# Patient Record
Sex: Female | Born: 1965 | Race: Black or African American | Hispanic: No
Health system: Southern US, Community
[De-identification: ages and names within clinical notes are randomized; demographics above are authoritative.]

## PROBLEM LIST (undated history)

## (undated) ENCOUNTER — Ambulatory Visit (HOSPITAL_COMMUNITY): Payer: Federal, State, Local not specified - PPO

## (undated) DIAGNOSIS — G4733 Obstructive sleep apnea (adult) (pediatric): Secondary | ICD-10-CM

## (undated) DIAGNOSIS — K5909 Other constipation: Secondary | ICD-10-CM

## (undated) DIAGNOSIS — I10 Essential (primary) hypertension: Secondary | ICD-10-CM

## (undated) DIAGNOSIS — K219 Gastro-esophageal reflux disease without esophagitis: Secondary | ICD-10-CM

## (undated) DIAGNOSIS — I471 Supraventricular tachycardia, unspecified: Secondary | ICD-10-CM

## (undated) DIAGNOSIS — E785 Hyperlipidemia, unspecified: Secondary | ICD-10-CM

## (undated) DIAGNOSIS — M5430 Sciatica, unspecified side: Secondary | ICD-10-CM

## (undated) HISTORY — DX: Other constipation: K59.09

## (undated) HISTORY — PX: BREAST BIOPSY: SHX20

## (undated) HISTORY — DX: Hyperlipidemia, unspecified: E78.5

## (undated) HISTORY — PX: KNEE SURGERY: SHX244

## (undated) HISTORY — DX: Obstructive sleep apnea (adult) (pediatric): G47.33

---

## 2008-02-11 HISTORY — PX: LAPAROSCOPIC VAGINAL HYSTERECTOMY: SUR798

## 2020-02-11 HISTORY — PX: COLONOSCOPY: SHX174

## 2020-02-23 LAB — HM COLONOSCOPY

## 2020-06-04 ENCOUNTER — Encounter (HOSPITAL_COMMUNITY): Payer: Self-pay | Admitting: *Deleted

## 2020-06-04 ENCOUNTER — Emergency Department (HOSPITAL_COMMUNITY): Payer: 59

## 2020-06-04 ENCOUNTER — Emergency Department (HOSPITAL_COMMUNITY)
Admission: EM | Admit: 2020-06-04 | Discharge: 2020-06-05 | Disposition: A | Discharge status: Home / Self Care | Payer: 59 | Attending: Emergency Medicine | Admitting: Emergency Medicine

## 2020-06-04 DIAGNOSIS — R079 Chest pain, unspecified: Secondary | ICD-10-CM | POA: Diagnosis not present

## 2020-06-04 DIAGNOSIS — I10 Essential (primary) hypertension: Secondary | ICD-10-CM | POA: Insufficient documentation

## 2020-06-04 DIAGNOSIS — M25531 Pain in right wrist: Secondary | ICD-10-CM | POA: Insufficient documentation

## 2020-06-04 DIAGNOSIS — M542 Cervicalgia: Secondary | ICD-10-CM | POA: Insufficient documentation

## 2020-06-04 DIAGNOSIS — M25511 Pain in right shoulder: Secondary | ICD-10-CM | POA: Insufficient documentation

## 2020-06-04 DIAGNOSIS — M79642 Pain in left hand: Secondary | ICD-10-CM | POA: Insufficient documentation

## 2020-06-04 DIAGNOSIS — M25562 Pain in left knee: Secondary | ICD-10-CM | POA: Diagnosis not present

## 2020-06-04 DIAGNOSIS — Y92410 Unspecified street and highway as the place of occurrence of the external cause: Secondary | ICD-10-CM | POA: Diagnosis not present

## 2020-06-04 HISTORY — DX: Sciatica, unspecified side: M54.30

## 2020-06-04 HISTORY — DX: Supraventricular tachycardia, unspecified: I47.10

## 2020-06-04 HISTORY — DX: Supraventricular tachycardia: I47.1

## 2020-06-04 HISTORY — DX: Essential (primary) hypertension: I10

## 2020-06-04 MED ORDER — METHOCARBAMOL 500 MG PO TABS: 500.0000 mg | ORAL_TABLET | Freq: Two times a day (BID) | ORAL | 0 refills | Status: AC

## 2020-06-04 MED ORDER — KETOROLAC TROMETHAMINE 15 MG/ML IJ SOLN
15.0000 mg | Freq: Once | INTRAMUSCULAR | Status: AC
  Administered 2020-06-04: 15 mg via INTRAMUSCULAR
  Filled 2020-06-04: qty 1

## 2020-06-04 MED ORDER — NAPROXEN 375 MG PO TABS: 375.0000 mg | ORAL_TABLET | Freq: Two times a day (BID) | ORAL | 0 refills | Status: AC
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
## 2020-06-04 NOTE — Discharge Instructions (Signed)
I have prescribed muscle relaxers for your pain, please do not drink or drive while taking this medications as they can make you drowsy.    Please follow-up with PCP in 1 week for reevaluation of your symptoms.    If you experience any headache, shortness of breath or worsening symptoms please return to the ED.
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
## 2020-06-04 NOTE — ED Triage Notes (Signed)
Neck and back pain and soreness in bilateral forearms.

## 2022-06-20 ENCOUNTER — Encounter (HOSPITAL_COMMUNITY): Payer: Self-pay | Admitting: Emergency Medicine

## 2022-06-20 ENCOUNTER — Ambulatory Visit (HOSPITAL_COMMUNITY)
Admission: EM | Admit: 2022-06-20 | Discharge: 2022-06-20 | Disposition: A | Discharge status: Home / Self Care | Payer: Federal, State, Local not specified - PPO | Attending: Internal Medicine | Admitting: Internal Medicine

## 2022-06-20 ENCOUNTER — Other Ambulatory Visit: Payer: Self-pay

## 2022-06-20 DIAGNOSIS — N3000 Acute cystitis without hematuria: Secondary | ICD-10-CM

## 2022-06-20 HISTORY — DX: Gastro-esophageal reflux disease without esophagitis: K21.9

## 2022-06-20 LAB — POCT URINALYSIS DIP (MANUAL ENTRY)
Bilirubin, UA: NEGATIVE
Blood, UA: NEGATIVE
Glucose, UA: NEGATIVE mg/dL
Ketones, POC UA: NEGATIVE mg/dL
Nitrite, UA: POSITIVE — AB
Protein Ur, POC: NEGATIVE mg/dL
Spec Grav, UA: 1.02
Urobilinogen, UA: 0.2 U/dL
pH, UA: 7

## 2022-06-20 MED ORDER — MICONAZOLE 3 200 MG VA SUPP: 200.0000 mg | Freq: Every day | VAGINAL | 0 refills | Status: DC

## 2022-06-20 MED ORDER — AMOXICILLIN-POT CLAVULANATE 875-125 MG PO TABS: 1.0000 | ORAL_TABLET | Freq: Two times a day (BID) | ORAL | 0 refills | Status: AC

## 2022-06-20 NOTE — Discharge Instructions (Addendum)
Your urine color is pending if any changes in treatment is warranted based on culture results we will call you directly the number provided on your chart.

## 2022-06-20 NOTE — ED Triage Notes (Signed)
Pt here for urinary urgency/frequency and burning with urination.  Sx since Tuesday.  No fevers.  Hx of UTI and feels same.

## 2022-06-20 NOTE — ED Provider Notes (Signed)
[facility]    CSN: [id number] Arrival date & time: 06/20/22  1731      History   Chief Complaint Chief Complaint  Patient presents with   Urinary Frequency    HPI Martha Salazar is a 57 y.o. female.   HPI Martha Salazar is a 57 y.o. female presents for evaluation of urinary frequency, urgency and dysuria x 4 days, without flank pain, fever, chills, or abnormal vaginal discharge or bleeding. Previous history of recurrent  UTI, last UTI 08/08/2021 was treated with Augmentin and cleared without complications.  Patient uncertain of urine pathology.  No LMP recorded. Patient is postmenopausal.   Past Medical History:  Diagnosis Date   Acid reflux    Hypertension    Sciatica    SVT (supraventricular tachycardia)     There are no problems to display for this patient.   History reviewed. No pertinent surgical history.  OB History   No obstetric history on file.      Home Medications    Prior to Admission medications   Medication Sig Start Date End Date Taking? Authorizing Provider  amoxicillin-clavulanate (AUGMENTIN) 875-125 MG tablet Take 1 tablet by mouth every 12 (twelve) hours for 10 days. 06/20/22 06/30/22 Yes Bing Neighbors, NP  carvedilol (COREG) 25 MG tablet Take 25 mg by mouth 2 (two) times daily with a meal.   Yes [provider]  diltiazem (CARDIZEM) 30 MG tablet Take 30 mg by mouth daily as needed (tachycardia).   Yes [provider]  DULoxetine (CYMBALTA) 30 MG capsule Take 30 mg by mouth daily.   Yes [provider]  flecainide (TAMBOCOR) 50 MG tablet Take 50 mg by mouth 2 (two) times daily.   Yes [provider]  linaclotide (LINZESS) 72 MCG capsule Take 72 mcg by mouth daily before breakfast.   Yes [provider]  losartan (COZAAR) 100 MG tablet Take 100 mg by mouth daily.   Yes [provider]  miconazole (MICONAZOLE 3) 200 MG vaginal suppository Place 1 suppository (200 mg total)  vaginally at bedtime. 06/20/22  Yes Bing Neighbors, NP  omeprazole (PRILOSEC) 20 MG capsule Take 20 mg by mouth daily.   Yes [provider]  spironolactone (ALDACTONE) 25 MG tablet Take 25 mg by mouth daily.   Yes [provider]  VITAMIN D, CHOLECALCIFEROL, PO Take by mouth.   Yes [provider]    Family History History reviewed. No pertinent family history.  Social History Social History   Substance Use Topics   Alcohol use: Never   Drug use: Never     Allergies   Hctz [hydrochlorothiazide], Sulfa antibiotics, and Wound dressing adhesive   Review of Systems Review of Systems Pertinent negatives listed in HPI   Physical Exam Triage Vital Signs ED Triage Vitals [06/20/22 1739]  Enc Vitals Group     BP (!) 158/89     Pulse Rate 73     Resp 16     Temp 98.4 F (36.9 C)     Temp Source Oral     SpO2 98 %     Weight 167 lb (75.8 kg)     Height 5\' 3"  (1.6 m)     Head Circumference      Peak Flow      Pain Score 0     Pain Loc      Pain Edu?      Excl. in GC?    No data found.  Updated Vital  Signs BP (!) 158/89   Pulse 73   Temp 98.4 F (36.9 C) (Oral)   Resp 16   Ht 5\' 3"  (1.6 m)   Wt 167 lb (75.8 kg)   SpO2 98%   BMI 29.58 kg/m   Visual Acuity Right Eye Distance:   Left Eye Distance:   Bilateral Distance:    Right Eye Near:   Left Eye Near:    Bilateral Near:     Physical Exam General appearance: alert, well developed, well nourished, cooperative  Head: Normocephalic, without obvious abnormality, atraumatic Heart: Rate and Rhythm normal.   Respiratory: Respirations even and unlabored, normal respiratory rate CVA:  No flank pain Skin: Skin color, texture, turgor normal. No rashes seen  Psych: Appropriate mood and affect.   UC Treatments / Results  Labs (all labs ordered are listed, but only abnormal results are displayed) Labs Reviewed  POCT URINALYSIS DIP (MANUAL ENTRY) - Abnormal; Notable for the following  components:      Result Value   Nitrite, UA Positive (*)    Leukocytes, UA Moderate (2+) (*)    All other components within normal limits    EKG   Radiology No results found.  Procedures Procedures (including critical care time)  Medications Ordered in UC Medications - No data to display  Initial Impression / Assessment and Plan / UC Course  I have reviewed the triage vital signs and the nursing notes.  Pertinent labs & imaging results that were available during my care of the patient were reviewed by me and considered in my medical decision making (see chart for details).     UA abnormal and findings consistent with UTI. Empiric antibiotic treatment initiated.  Encouraged increase intake of water. Recommended cranberry tablets, wiping from front to back, and urinating prior to and after cortis to reduce risk for reinfection. Urine culture pending.  ER if symptoms become severe. Follow-up with PCP if symptoms do not completely resolve.   Final Clinical Impressions(s) / UC Diagnoses   Final diagnoses:  Acute cystitis without hematuria     Discharge Instructions      Your urine color is pending if any changes in treatment is warranted based on culture results we will call you directly the number provided on your chart.     ED Prescriptions     Medication Sig Dispense Auth. Provider   amoxicillin-clavulanate (AUGMENTIN) 875-125 MG tablet Take 1 tablet by mouth every 12 (twelve) hours for 10 days. 20 tablet Bing Neighbors, NP   miconazole (MICONAZOLE 3) 200 MG vaginal suppository Place 1 suppository (200 mg total) vaginally at bedtime. 3 suppository Bing Neighbors, NP      PDMP not reviewed this encounter.   Bing Neighbors, NP 06/20/22 1939

## 2022-08-20 ENCOUNTER — Ambulatory Visit: Payer: Federal, State, Local not specified - PPO | Admitting: Medical

## 2022-08-20 ENCOUNTER — Ambulatory Visit
Admission: RE | Admit: 2022-08-20 | Discharge: 2022-08-20 | Disposition: A | Discharge status: Home / Self Care | Payer: Federal, State, Local not specified - PPO | Source: Ambulatory Visit | Attending: Medical | Admitting: Medical

## 2022-08-20 VITALS — BP 120/70 | HR 72 | Temp 97.4°F | Ht 64.0 in | Wt 173.8 lb

## 2022-08-20 DIAGNOSIS — M542 Cervicalgia: Secondary | ICD-10-CM | POA: Diagnosis not present

## 2022-08-20 DIAGNOSIS — M549 Dorsalgia, unspecified: Secondary | ICD-10-CM

## 2022-08-20 DIAGNOSIS — G8929 Other chronic pain: Secondary | ICD-10-CM
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
## 2022-08-20 NOTE — Patient Instructions (Signed)
Please go to [facility] for your neck xray.   Their hours are 8am - 4:30 pm Monday - Friday.  Take your insurance card with you.  [facility] [phone]   [address] [place], [place] [address]

## 2022-08-26 NOTE — Progress Notes (Signed)
C-spine x-ray shows degenerative changes in the neck but also some disc space narrowing similar to prior x-ray.  X-ray does not show nerves or disc so we are limited in what we can see.  Recommendation at this point would be referral to physical therapy or chiropractor or if significant pain down on arms such as pain numbness or tingling we could pursue MRI of the C-spine.  Generally I would want someone to do a trial of physical therapy or chiropractic therapy first  See how she wants to move forward

## 2022-10-27 ENCOUNTER — Other Ambulatory Visit: Payer: Self-pay

## 2022-10-27 ENCOUNTER — Other Ambulatory Visit: Payer: Self-pay | Admitting: Medical

## 2022-10-27 ENCOUNTER — Encounter: Payer: Self-pay | Admitting: Pharmacist

## 2022-10-27 MED ORDER — FLUOCINONIDE 0.05 % EX SOLN
1.0000 | Freq: Two times a day (BID) | CUTANEOUS | 1 refills | Status: DC
  Filled 2022-10-27: qty 60, 1d supply, fill #0

## 2022-10-27 MED ORDER — DULOXETINE HCL 30 MG PO CPEP
30.0000 mg | ORAL_CAPSULE | Freq: Every day | ORAL | 0 refills | Status: DC
  Filled 2022-10-27: qty 90, 90d supply, fill #0

## 2022-10-27 MED ORDER — TIZANIDINE HCL 2 MG PO TABS
2.0000 mg | ORAL_TABLET | Freq: Every evening | ORAL | 1 refills | Status: AC | PRN
  Filled 2022-10-27: qty 30, 30d supply, fill #0

## 2022-10-27 MED ORDER — LINACLOTIDE 72 MCG PO CAPS
72.0000 ug | ORAL_CAPSULE | Freq: Every day | ORAL | 0 refills | Status: DC
  Filled 2022-10-27: qty 90, 90d supply, fill #0

## 2022-10-29 ENCOUNTER — Other Ambulatory Visit: Payer: Self-pay | Admitting: Medical

## 2022-11-10 ENCOUNTER — Other Ambulatory Visit: Payer: Self-pay

## 2022-11-12 MED ORDER — DULOXETINE HCL 30 MG PO CPEP: 30.0000 mg | ORAL_CAPSULE | Freq: Every day | ORAL | 0 refills | Status: DC

## 2022-11-12 MED ORDER — LINACLOTIDE 72 MCG PO CAPS: 72.0000 ug | ORAL_CAPSULE | Freq: Every day | ORAL | 0 refills | Status: DC

## 2022-11-12 MED ORDER — FLUOCINONIDE 0.05 % EX SOLN: 1.0000 | Freq: Two times a day (BID) | CUTANEOUS | 1 refills | Status: AC

## 2022-12-31 ENCOUNTER — Telehealth: Payer: Self-pay | Admitting: Medical

## 2022-12-31 NOTE — Telephone Encounter (Signed)
P.A. Rolan Lipa

## 2023-02-15 NOTE — Telephone Encounter (Signed)
 P.A. approved til 11/25
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
## 2023-03-30 ENCOUNTER — Other Ambulatory Visit: Payer: Self-pay | Admitting: Medical

## 2023-03-30 DIAGNOSIS — Z0000 Encounter for general adult medical examination without abnormal findings: Secondary | ICD-10-CM

## 2023-04-24 ENCOUNTER — Ambulatory Visit
Admission: RE | Admit: 2023-04-24 | Discharge: 2023-04-24 | Disposition: A | Discharge status: Home / Self Care | Payer: Federal, State, Local not specified - PPO | Source: Ambulatory Visit | Attending: Medical | Admitting: Medical

## 2023-04-24 DIAGNOSIS — Z0000 Encounter for general adult medical examination without abnormal findings: Secondary | ICD-10-CM

## 2023-05-21 ENCOUNTER — Ambulatory Visit: Admitting: Medical

## 2023-05-21 VITALS — BP 120/70 | HR 64 | Wt 162.2 lb

## 2023-05-21 DIAGNOSIS — K5909 Other constipation: Secondary | ICD-10-CM

## 2023-05-21 DIAGNOSIS — K644 Residual hemorrhoidal skin tags: Secondary | ICD-10-CM

## 2023-05-21 DIAGNOSIS — K602 Anal fissure, unspecified: Secondary | ICD-10-CM

## 2023-05-21 MED ORDER — DILTIAZEM GEL 2 %
1.0000 | Freq: Four times a day (QID) | CUTANEOUS | 1 refills | Status: AC
Start: 2023-05-21 — End: ?

## 2023-05-21 MED ORDER — HYDROCORTISONE 2.5 % EX CREA: TOPICAL_CREAM | Freq: Two times a day (BID) | CUTANEOUS | 1 refills | Status: AC

## 2023-05-21 NOTE — Progress Notes (Signed)
 Called med into [facility].

## 2023-05-21 NOTE — Progress Notes (Signed)
 Subjective:  Martha Salazar is a 58 y.o. female who presents for Chief Complaint  Patient presents with   Hemorrhoids    Hemorrhoids and fissure for 3 weeks. It has finally gone down to a pea size. Stopped bleeding this past monday     Here for concerns.  She has a history of anal fissure and external hemorrhoids.  For the past 3 weeks she has been having flareup of both.  A few weeks ago she had a quarter size bright red hemorrhoid that was bleeding and tender but it is calm down to the size of a pea now.  She has been using witch hazel topically for this.  She also has a history of fissure on and off.  She is seen different providers for this in the past.  At 1 point she even got Botox but this left her with problems with incontinence of feces for about 2 years on and off.  She will never do that again.  She is not as tender today as she has been last 3 weeks.  She uses tissue and wet wipes daily.  She has a history of constipation.  Uses Linzess often but not daily.  She does get a lot of fiber and water in the diet.  No other aggravating or relieving factors.    No other c/o.  Past Medical History:  Diagnosis Date   Acid reflux    Hypertension    Sciatica    SVT (supraventricular tachycardia) (HCC)    Current Outpatient Medications on File Prior to Visit  Medication Sig Dispense Refill   carvedilol (COREG) 25 MG tablet Take 25 mg by mouth 2 (two) times daily with a meal.     DULoxetine (CYMBALTA) 30 MG capsule Take 1 capsule (30 mg total) by mouth daily. 90 capsule 0   flecainide (TAMBOCOR) 50 MG tablet Take 50 mg by mouth 2 (two) times daily.     fluocinonide (LIDEX) 0.05 % external solution Apply 1 Application topically 2 (two) times daily. 60 mL 1   linaclotide (LINZESS) 72 MCG capsule Take 1 capsule (72 mcg total) by mouth daily before breakfast. 90 capsule 0   losartan (COZAAR) 100 MG tablet Take 100 mg by mouth daily.     omeprazole (PRILOSEC) 20 MG capsule Take 20 mg by  mouth daily.     spironolactone (ALDACTONE) 25 MG tablet Take 25 mg by mouth daily.     tiZANidine (ZANAFLEX) 2 MG tablet Take 1 tablet (2 mg total) by mouth at bedtime as needed for muscle spasms. 30 tablet 1   VITAMIN D, CHOLECALCIFEROL, PO Take by mouth.     No current facility-administered medications on file prior to visit.     The following portions of the patient's history were reviewed and updated as appropriate: allergies, current medications, past family history, past medical history, past social history, past surgical history and problem list.  ROS Otherwise as in subjective above    Objective: BP 120/70   Pulse 64   Wt 162 lb 3.2 oz (73.6 kg)   BMI 27.84 kg/m   General appearance: alert, no distress, well developed, well nourished DRE: Left lateral of anus with a 8 mm diameter nontender hemorrhoid is resolving.  No obvious fissure today.  Nontender to palpation.  No other abnormality.  Exam chaperoned by nurse.    Assessment: Encounter Diagnoses  Name Primary?   External hemorrhoid Yes   Rectal fissure    Chronic constipation  Plan: We discussed symptoms, prior history, recommendations  Advised when she has a hemorrhoid flareup to use hot water soaks for 20 minutes at a time.  She can use hydrocortisone cream periodically as needed for flareup of hemorrhoid for 3 to 5 days at a time.  We will call out diltiazem gel if she develops another fissure.  She does not have an obvious fissure today.  Referral for a local GI doctor.  Her prior gastroenterologist was in the  [facility] area.  Counseled on chronic constipation, prevention, water and fiber intake.  Martha Salazar was seen today for hemorrhoids.  Diagnoses and all orders for this visit:  External hemorrhoid -     Ambulatory referral to Gastroenterology  Rectal fissure -     Ambulatory referral to Gastroenterology  Chronic constipation -     Ambulatory referral to Gastroenterology  Other  orders -     hydrocortisone 2.5 % cream; Apply topically 2 (two) times daily. -     diltiazem 2 % GEL; Apply 1 Application topically 4 (four) times daily.    Follow up: for physical soon

## 2023-07-21 ENCOUNTER — Other Ambulatory Visit: Payer: Self-pay | Admitting: Medical

## 2023-07-24 ENCOUNTER — Telehealth: Payer: Self-pay

## 2023-07-24 NOTE — Telephone Encounter (Signed)
   Copied from CRM [id number]. Topic: General - Other >> Jul 24, 2023  8:44 AM Marissa P wrote: Reason for CRM: Patient says doctor did a referral to gi, they called but told her to submit a request to be transferred to them. The patient did, they called saying need to make an appt but haven't gotten a call back since. Please follow up with patient because its been awhile she hasn't heard nothing and needs the information again.

## 2023-07-28 NOTE — Telephone Encounter (Signed)
 Appointment scheduled.

## 2023-07-30 ENCOUNTER — Other Ambulatory Visit: Payer: Self-pay | Admitting: Medical

## 2023-07-30 ENCOUNTER — Ambulatory Visit: Admitting: Family Medicine

## 2023-07-30 VITALS — BP 128/90 | HR 88 | Wt 167.8 lb

## 2023-07-30 DIAGNOSIS — R198 Other specified symptoms and signs involving the digestive system and abdomen: Secondary | ICD-10-CM

## 2023-07-30 DIAGNOSIS — E739 Lactose intolerance, unspecified: Secondary | ICD-10-CM

## 2023-07-30 DIAGNOSIS — I4891 Unspecified atrial fibrillation: Secondary | ICD-10-CM

## 2023-07-30 DIAGNOSIS — Z8719 Personal history of other diseases of the digestive system: Secondary | ICD-10-CM

## 2023-07-30 DIAGNOSIS — R110 Nausea: Secondary | ICD-10-CM | POA: Diagnosis not present

## 2023-07-30 DIAGNOSIS — G8929 Other chronic pain: Secondary | ICD-10-CM

## 2023-07-30 MED ORDER — DULOXETINE HCL 30 MG PO CPEP: 30.0000 mg | ORAL_CAPSULE | Freq: Every day | ORAL | 0 refills | Status: AC

## 2023-07-30 NOTE — Progress Notes (Signed)
   Subjective:    Patient ID: Martha Salazar, female    DOB: [birth year], 58 y.o.   MRN: [id number]  HPI She is here for evaluation of a 2-week history of difficulty with nausea and malaise as well as increased flatus as well as eructation.  She also notes that her previous history of constipation and use of Linzess  is not needed as she is actually having increased bowel movements.  She states that they are foul-smelling but did not have a greasy or rancid component to it.  Food can cause an increase in gas in both directions.  She also describes 1 episode of some mid chest pain but the symptoms went away relatively quickly.  She also has a history of lactose intolerance and uses Lactaid on an as-needed basis.  She also has a history of atrial fibrillation and is on flecainide.  She is scheduled for complete exam in July.   Review of Systems     Objective:    Physical Exam Alert and in no distress.  Cardiac exam shows regular rhythm without murmurs or gallops.  Lungs are clear to auscultation.  Abdominal exam shows no masses or tenderness but very active bowel sounds.       Assessment & Plan:  Lactose intolerance  History of constipation  Nausea  Atrial fibrillation, unspecified type (HCC)  Borborygmi Use the Lactaid regularly.  Double your Prilosec dosing to 2 pills/day.  Take a probiotic She does have an appointment set up on July 8.  Her symptoms are suggestive of a malabsorption type issue so I want to see if doubling the Prilosec as well as continue with the Lactaid and probiotic has any effect on this.  If not she might need to be seen by gastroenterology.

## 2023-07-30 NOTE — Telephone Encounter (Signed)
 Copied from CRM [id number]. Topic: Clinical - Medication Refill >> Jul 30, 2023  1:17 PM Carlatta H wrote: Medication: DULoxetine  (CYMBALTA ) 30 MG capsule [[id number]]  Has the patient contacted their pharmacy? Yes (Agent: If no, request that the patient contact the pharmacy for the refill. If patient does not wish to contact the pharmacy document the reason why and proceed with request.) (Agent: If yes, when and what did the pharmacy advise?)No longer has prescription  This is the patient's preferred pharmacy:  [id number] - [place], [place] - [address] AT [facility] [address] [place] [address] Phone: [phone] Fax: [phone]    Is this the correct pharmacy for this prescription? Yes If no, delete pharmacy and type the correct one.   Has the prescription been filled recently? No  Is the patient out of the medication? Yes  Has the patient been seen for an appointment in the last year OR does the patient have an upcoming appointment? Yes  Can we respond through MyChart? No  Agent: Please be advised that Rx refills may take up to 3 business days. We ask that you follow-up with your pharmacy.

## 2023-07-30 NOTE — Telephone Encounter (Signed)
 Last apt 05/21/23

## 2023-07-30 NOTE — Addendum Note (Signed)
 Addended by: Alycea Segoviano, Abraham Hoffmann E on: 07/30/2023 05:42 PM   Modules accepted: Orders

## 2023-07-30 NOTE — Patient Instructions (Signed)
 Use the Lactaid regularly.  Double your Prilosec dosing to 2 pills/day.  Take a probiotic

## 2023-08-18 ENCOUNTER — Ambulatory Visit: Admitting: Medical

## 2023-08-18 ENCOUNTER — Encounter: Payer: Self-pay | Admitting: Medical

## 2023-08-18 VITALS — BP 130/90 | HR 85 | Ht 64.0 in | Wt 169.2 lb

## 2023-08-18 DIAGNOSIS — N951 Menopausal and female climacteric states: Secondary | ICD-10-CM

## 2023-08-18 DIAGNOSIS — Z23 Encounter for immunization: Secondary | ICD-10-CM

## 2023-08-18 DIAGNOSIS — Z0000 Encounter for general adult medical examination without abnormal findings: Secondary | ICD-10-CM | POA: Diagnosis not present

## 2023-08-18 DIAGNOSIS — Z90710 Acquired absence of both cervix and uterus: Secondary | ICD-10-CM | POA: Diagnosis not present

## 2023-08-18 DIAGNOSIS — Z780 Asymptomatic menopausal state: Secondary | ICD-10-CM | POA: Insufficient documentation

## 2023-08-18 DIAGNOSIS — R002 Palpitations: Secondary | ICD-10-CM

## 2023-08-18 DIAGNOSIS — I4891 Unspecified atrial fibrillation: Secondary | ICD-10-CM

## 2023-08-18 DIAGNOSIS — Z13220 Encounter for screening for lipoid disorders: Secondary | ICD-10-CM

## 2023-08-18 DIAGNOSIS — I471 Supraventricular tachycardia, unspecified: Secondary | ICD-10-CM

## 2023-08-18 DIAGNOSIS — Z1389 Encounter for screening for other disorder: Secondary | ICD-10-CM

## 2023-08-18 DIAGNOSIS — M79604 Pain in right leg: Secondary | ICD-10-CM

## 2023-08-18 LAB — LIPID PANEL

## 2023-08-18 NOTE — Progress Notes (Signed)
abstracted 

## 2023-08-18 NOTE — Progress Notes (Signed)
 Subjective:   HPI  Martha Salazar is a 58 y.o. female who presents for Chief Complaint  Patient presents with   Annual Exam    Fasting cpe, hot flashes a lot, would like referral to GYN for her breast and pelvic.     Patient Care Team: Jaslin Novitski, Alm GORMAN RIGGERS as PCP - General (Family Medicine) Dr. Marko Go and Jenkins Farr, PA, cardiology at [facility] Dr. Delon Her, bariatrics Dr. Johnston Isaac, neurology Dr. Jenel Lash, ophthalmology Dr. Urban July, pain medicine/sleep medicine Dentist   Concerns: Here for well visit.  Fasting today.  She gets right leg pain and sciatica flareup periodically.  She notes that she had an injury to her muscle and nerve with tourniquet being too tight from prior knee surgery.  Has had problems with the right leg ever since  Lately she has been having problems with orthostatic hypotension.  She is seeing cardiology.  She just saw them a week ago.  He was on Ozempic prior to help control weight and did well with this including less swelling in her legs.  She uses spironolactone for blood pressure and swelling.  She is having trouble getting Ozempic.   seeing bariatric clinic    Reviewed their medical, surgical, family, social, medication, and allergy history and updated chart as appropriate.  Allergies  Allergen Reactions   Amlodipine Swelling    Lower ext.   Diltiazem  Other (See Comments)    Pill only- causes Hot flashes   Hctz [Hydrochlorothiazide]    Sulfa Antibiotics Hives and Swelling   Wound Dressing Adhesive     Anything Adhesive will burn her    Past Medical History:  Diagnosis Date   Acid reflux    Chronic constipation    Hypertension    OSA (obstructive sleep apnea)    oral airway device   Sciatica    SVT (supraventricular tachycardia) (HCC)     Current Outpatient Medications on File Prior to Visit  Medication Sig Dispense Refill   carvedilol (COREG) 25 MG tablet Take 25 mg by mouth 2 (two) times daily with a meal.      diltiazem  2 % GEL Apply 1 Application topically 4 (four) times daily. 30 g 1   DULoxetine  (CYMBALTA ) 30 MG capsule Take 1 capsule (30 mg total) by mouth daily. 90 capsule 0   flecainide (TAMBOCOR) 50 MG tablet Take 50 mg by mouth 2 (two) times daily.     fluocinonide  (LIDEX ) 0.05 % external solution Apply 1 Application topically 2 (two) times daily. 60 mL 1   hydrocortisone  2.5 % cream Apply topically 2 (two) times daily. 30 g 1   LINZESS  72 MCG capsule TAKE 1 CAPSULE BY MOUTH DAILY BEFORE BREAKFAST. 90 capsule 0   losartan (COZAAR) 100 MG tablet Take 100 mg by mouth daily.     omeprazole (PRILOSEC) 20 MG capsule Take 20 mg by mouth daily.     spironolactone (ALDACTONE) 25 MG tablet Take 25 mg by mouth daily.     tiZANidine  (ZANAFLEX ) 2 MG tablet Take 1 tablet (2 mg total) by mouth at bedtime as needed for muscle spasms. 30 tablet 1   VITAMIN D , CHOLECALCIFEROL, PO Take by mouth. (Patient not taking: Reported on 07/30/2023)     No current facility-administered medications on file prior to visit.      Current Outpatient Medications:    carvedilol (COREG) 25 MG tablet, Take 25 mg by mouth 2 (two) times daily with a meal., Disp: , Rfl:  diltiazem  2 % GEL, Apply 1 Application topically 4 (four) times daily., Disp: 30 g, Rfl: 1   DULoxetine  (CYMBALTA ) 30 MG capsule, Take 1 capsule (30 mg total) by mouth daily., Disp: 90 capsule, Rfl: 0   flecainide (TAMBOCOR) 50 MG tablet, Take 50 mg by mouth 2 (two) times daily., Disp: , Rfl:    fluocinonide  (LIDEX ) 0.05 % external solution, Apply 1 Application topically 2 (two) times daily., Disp: 60 mL, Rfl: 1   hydrocortisone  2.5 % cream, Apply topically 2 (two) times daily., Disp: 30 g, Rfl: 1   LINZESS  72 MCG capsule, TAKE 1 CAPSULE BY MOUTH DAILY BEFORE BREAKFAST., Disp: 90 capsule, Rfl: 0   losartan (COZAAR) 100 MG tablet, Take 100 mg by mouth daily., Disp: , Rfl:    omeprazole (PRILOSEC) 20 MG capsule, Take 20 mg by mouth daily., Disp: , Rfl:     spironolactone (ALDACTONE) 25 MG tablet, Take 25 mg by mouth daily., Disp: , Rfl:    tiZANidine  (ZANAFLEX ) 2 MG tablet, Take 1 tablet (2 mg total) by mouth at bedtime as needed for muscle spasms., Disp: 30 tablet, Rfl: 1   VITAMIN D , CHOLECALCIFEROL, PO, Take by mouth. (Patient not taking: Reported on 07/30/2023), Disp: , Rfl:   Family History  Problem Relation Age of Onset   Cancer Mother        lung   Cancer Sister        lung   Heart disease Paternal Grandmother    Breast cancer Paternal Grandmother 29 - 61   Heart disease Paternal Grandfather     Past Surgical History:  Procedure Laterality Date   BREAST BIOPSY Left    CESAREAN SECTION     COLONOSCOPY  02/2020   repeat 2027   KNEE SURGERY Right    meniscal repair   LAPAROSCOPIC VAGINAL HYSTERECTOMY  2010   still has ovaries      Review of Systems  Constitutional:  Negative for chills, fever, malaise/fatigue and weight loss.  HENT:  Negative for congestion, ear pain, hearing loss, sore throat and tinnitus.   Eyes:  Negative for blurred vision, pain and redness.  Respiratory:  Negative for cough, hemoptysis and shortness of breath.   Cardiovascular:  Positive for palpitations. Negative for chest pain, orthopnea, claudication and leg swelling.  Gastrointestinal:  Negative for abdominal pain, blood in stool, constipation, diarrhea, nausea and vomiting.  Genitourinary:  Negative for dysuria, flank pain, frequency, hematuria and urgency.  Musculoskeletal:  Negative for falls, joint pain and myalgias.  Skin:  Negative for itching and rash.  Neurological:  Positive for sensory change and weakness. Negative for dizziness, tingling, speech change and headaches.  Endo/Heme/Allergies:  Negative for polydipsia. Does not bruise/bleed easily.  Psychiatric/Behavioral:  Negative for depression and memory loss. The patient is not nervous/anxious and does not have insomnia.         Objective:  BP (!) 130/90   Pulse 85   Ht 5' 4  (1.626 m)   Wt 169 lb 3.2 oz (76.7 kg)   SpO2 99%   BMI 29.04 kg/m   BP Readings from Last 3 Encounters:  08/18/23 (!) 130/90  07/30/23 (!) 128/90  05/21/23 120/70   Wt Readings from Last 3 Encounters:  08/18/23 169 lb 3.2 oz (76.7 kg)  07/30/23 167 lb 12.8 oz (76.1 kg)  05/21/23 162 lb 3.2 oz (73.6 kg)    General appearance: alert, no distress, WD/WN, African American female Skin: no worrisome lesions HEENT: normocephalic, conjunctiva/corneas normal, sclerae anicteric,  PERRLA, EOMi, nares patent, no discharge or erythema, pharynx normal Oral cavity: MMM, tongue normal, teeth normal Neck: supple, no lymphadenopathy, no thyromegaly, no masses, normal ROM, no bruits Chest: non tender, normal shape and expansion Heart: RRR, normal S1, S2, no murmurs Lungs: CTA bilaterally, no wheezes, rhonchi, or rales Abdomen: +bs, soft, non tender, non distended, no masses, no hepatomegaly, no splenomegaly, no bruits Back: non tender, normal ROM, no scoliosis Musculoskeletal: upper extremities non tender, no obvious deformity, normal ROM throughout, lower extremities non tender, no obvious deformity, normal ROM throughout Extremities: no edema, no cyanosis, no clubbing Pulses: 2+ symmetric, upper and lower extremities, normal cap refill Neurological: alert, oriented x 3, CN2-12 intact, strength normal upper extremities and lower extremities, sensation normal throughout, DTRs 2+ throughout, no cerebellar signs, gait normal Psychiatric: normal affect, behavior normal, pleasant  Breast/gyn/rectal - deferred to gynecology     Assessment and Plan :   Encounter Diagnoses  Name Primary?   Encounter for health maintenance examination in adult Yes   H/O vaginal hysterectomy    Postmenopausal estrogen deficiency    Hot flash, menopausal    Palpitation    Atrial fibrillation, unspecified type (HCC)    SVT (supraventricular tachycardia) (HCC)    Right leg pain    Screening for lipid disorders     Screening for hematuria or proteinuria    Need for pneumococcal 20-valent conjugate vaccination      This visit was a preventative care visit, also known as wellness visit or routine physical.   Topics typically include healthy lifestyle, diet, exercise, preventative care, vaccinations, sick and well care, proper use of emergency dept and after hours care, as well as other concerns.    Separate significant issues discussed: Chronic constipation that she continues on Linzess .  Recent issues with fissure, constipation, hemorrhoid.  She is following up with GI soon  SVT history, history of A-fib as well and recently has had problems with orthostatic hypotension-follow-up with cardiology soon as planned.  I reviewed the visit she had from a week ago with cardiology as well as blood work including comprehensive metabolic panel and CBC.  Albumin slightly low otherwise labs were fine.  Hot flashes, menopausal symptoms-referral at her request to gynecology  Sciatica/leg pain - stretch daily.  Consider phyiscal therapy or ortho consult   General Recommendations: Continue to return yearly for your annual wellness and preventative care visits.  This gives us  a chance to discuss healthy lifestyle, exercise, vaccinations, review your chart record, and perform screenings where appropriate.  I recommend you see your eye doctor yearly for routine vision care.  I recommend you see your dentist yearly for routine dental care including hygiene visits twice yearly.   Vaccination recommendations were reviewed Immunization History  Administered Date(s) Administered   Influenza, Seasonal, Injecte, Preservative Fre 01/06/2023   Influenza-Unspecified 10/29/2011, 10/13/2012, 11/24/2012, 11/30/2013, 11/08/2014, 11/07/2015, 11/05/2016, 10/29/2017   PFIZER Comirnaty(Gray Top)Covid-19 Tri-Sucrose Vaccine 03/07/2019, 04/01/2019, 01/27/2020, 06/01/2020   PNEUMOCOCCAL CONJUGATE-20 08/18/2023   Tdap 07/12/2010,  11/07/2020   Zoster Recombinant(Shingrix) 11/26/2017, 01/29/2018    Vaccine recommendations: Yearly flu shot, prevnar 20  Vaccines administered today: Counseled on the pneumococcal vaccine.  Vaccine information sheet given.  Pneumococcal vaccine Prevnar 20 given after consent obtained.    Screening for cancer: Colon cancer screening: Prior or last colon cancer screen: 2022, due 2027, but given recent GI issues, may need other eval.  Breast cancer screening: You should perform a self breast exam monthly.   We reviewed recommendations for regular  mammograms and breast cancer screening. Last mammogram: normal 04/2023.  Cervical cancer screening: We reviewed recommendations for pap smear screening. Last pap - s/p hysterectomy   Skin cancer screening: Check your skin regularly for new changes, growing lesions, or other lesions of concern Come in for evaluation if you have skin lesions of concern.  Lung cancer screening: If you have a greater than 20 pack year history of tobacco use, then you may qualify for lung cancer screening with a chest CT scan.   Please call your insurance company to inquire about coverage for this test.  Pancreatic cancer: no current screening test is available routinely recommended.  (Risk factors: Smoking, overweight or obese, diabetes, chronic pancreatitis, work Nurse, mental health, Solicitor, 76 year old or greater, female greater than female, African-American, family history of pancreatic cancer, hereditary breast, ovarian, melanoma, Lynch, Peutz-jeghers).  We currently don't have screenings for other cancers besides breast, cervical, colon, and lung cancers.  If you have a strong family history of cancer or have other cancer screening concerns, please let me know.    Bone health: Get at least 150 minutes of aerobic exercise weekly Get weight bearing exercise at least once weekly Bone density test:  A bone density test is an imaging test that uses a  type of X-ray to measure the amount of calcium and other minerals in your bones. The test may be used to diagnose or screen you for a condition that causes weak or thin bones (osteoporosis), predict your risk for a broken bone (fracture), or determine how well your osteoporosis treatment is working. The bone density test is recommended for females 65 and older, or females or males <65 if certain risk factors such as thyroid disease, long term use of steroids such as for asthma or rheumatological issues, vitamin D  deficiency, estrogen deficiency, family history of osteoporosis, self or family history of fragility fracture in first degree relative.  No prior bone density screen.   Order placed.  Expect a phone call to schedule   Heart health: Get at least 150 minutes of aerobic exercise weekly Limit alcohol It is important to maintain a healthy blood pressure and healthy cholesterol numbers  Heart disease screening: Screening for heart disease includes screening for blood pressure, fasting lipids, glucose/diabetes screening, BMI height to weight ratio, reviewed of smoking status, physical activity, and diet.    Goals include blood pressure 120/80 or less, maintaining a healthy lipid/cholesterol profile, preventing diabetes or keeping diabetes numbers under good control, not smoking or using tobacco products, exercising most days per week or at least 150 minutes per week of exercise, and eating healthy variety of fruits and vegetables, healthy oils, and avoiding unhealthy food choices like fried food, fast food, high sugar and high cholesterol foods.     Vascular disease screening: For high risk individuals including smokers, diabetes, patients with known heart disease or high blood pressure, kidney disease, and others, screening for vascular disease or atherosclerosis of the arteries is available.  Examples may include carotid ultrasound, abdominal aortic ultrasound, ABI blood flow screening in  the legs, thoracic aorta screening.    Medical care options: I recommend you continue to seek care here first for routine care.  We try really hard to have available appointments Monday through Friday daytime hours for sick visits, acute visits, and physicals.  Urgent care should be used for after hours and weekends for significant issues that cannot wait till the next day.  The emergency department should be used for significant potentially life-threatening emergencies.  The emergency department is expensive, can often have long wait times for less significant concerns, so try to utilize primary care, urgent care, or telemedicine when possible to avoid unnecessary trips to the emergency department.  Virtual visits and telemedicine have been introduced since the pandemic started in 2020, and can be convenient ways to receive medical care.  We offer virtual appointments as well to assist you in a variety of options to seek medical care.   Legal Take the time to do a Last Will and Testament, advanced directives including Healthcare Power of Attorney and Living Will documents.  Do not leave your family with burdens that can be handled ahead of time.   Advanced Directives: I recommend you consider completing a Health Care Power of Attorney and Living Will.   These documents respect your wishes and help alleviate burdens on your loved ones if you were to become terminally ill or be in a position to need those documents enforced.    You can complete Advanced Directives yourself, have them notarized, then have copies made for our office, for you and for anybody you feel should have them in safe keeping.  Or, you can have an attorney prepare these documents.   If you haven't updated your Last Will and Testament in a while, it may be worthwhile having an attorney prepare these documents together and save on some costs.      Duane was seen today for annual exam.  Diagnoses and all orders for this  visit:  Encounter for health maintenance examination in adult -     Ambulatory referral to Gynecology -     DG Bone Density; Future -     TSH + free T4 -     Lipid panel -     VITAMIN D  25 Hydroxy (Vit-D Deficiency, Fractures) -     Urinalysis, Routine w reflex microscopic -     Magnesium  H/O vaginal hysterectomy  Postmenopausal estrogen deficiency -     DG Bone Density; Future  Hot flash, menopausal  Palpitation -     Magnesium  Atrial fibrillation, unspecified type (HCC)  SVT (supraventricular tachycardia) (HCC) -     Magnesium  Right leg pain  Screening for lipid disorders -     Lipid panel  Screening for hematuria or proteinuria -     Urinalysis, Routine w reflex microscopic  Need for pneumococcal 20-valent conjugate vaccination -     Pneumococcal conjugate vaccine 20-valent (Prevnar 20)    Follow-up pending labs, yearly for physical

## 2023-08-19 ENCOUNTER — Other Ambulatory Visit: Payer: Self-pay | Admitting: Medical

## 2023-08-19 ENCOUNTER — Ambulatory Visit: Payer: Self-pay | Admitting: Medical

## 2023-08-19 LAB — URINALYSIS, ROUTINE W REFLEX MICROSCOPIC
Bilirubin, UA: NEGATIVE
Glucose, UA: NEGATIVE
Ketones, UA: NEGATIVE
Leukocytes,UA: NEGATIVE
Nitrite, UA: NEGATIVE
RBC, UA: NEGATIVE
Specific Gravity, UA: 1.021 (ref 1.005–1.030)
Urobilinogen, Ur: 0.2 mg/dL (ref 0.2–1.0)
pH, UA: 7.5 (ref 5.0–7.5)

## 2023-08-19 LAB — TSH+FREE T4
Free T4: 1.09 ng/dL (ref 0.82–1.77)
TSH: 1.45 u[IU]/mL (ref 0.450–4.500)

## 2023-08-19 LAB — VITAMIN D 25 HYDROXY (VIT D DEFICIENCY, FRACTURES): Vit D, 25-Hydroxy: 25.2 ng/mL — AB (ref 30.0–100.0)

## 2023-08-19 LAB — LIPID PANEL
Cholesterol, Total: 261 mg/dL — AB (ref 100–199)
HDL: 79 mg/dL (ref >39)
LDL CALC COMMENT:: 3.3 ratio (ref 0.0–4.4)
LDL Chol Calc (NIH): 172 mg/dL — AB (ref 0–99)
Triglycerides: 61 mg/dL (ref 0–149)
VLDL Cholesterol Cal: 10 mg/dL (ref 5–40)

## 2023-08-19 LAB — MAGNESIUM: Magnesium: 2 mg/dL (ref 1.6–2.3)

## 2023-08-19 MED ORDER — VITAMIN D (ERGOCALCIFEROL) 1.25 MG (50000 UNIT) PO CAPS: 50000.0000 [IU] | ORAL_CAPSULE | ORAL | 3 refills | Status: AC

## 2023-08-19 NOTE — Progress Notes (Signed)
 Results sent through MyChart

## 2023-10-22 ENCOUNTER — Other Ambulatory Visit (HOSPITAL_COMMUNITY)
Admission: RE | Admit: 2023-10-22 | Discharge: 2023-10-22 | Disposition: A | Discharge status: Home / Self Care | Source: Ambulatory Visit | Attending: Radiology | Admitting: Radiology

## 2023-10-22 ENCOUNTER — Encounter: Payer: Self-pay | Admitting: Radiology

## 2023-10-22 ENCOUNTER — Ambulatory Visit (INDEPENDENT_AMBULATORY_CARE_PROVIDER_SITE_OTHER): Admitting: Radiology

## 2023-10-22 VITALS — BP 144/86 | HR 83 | Ht 62.75 in | Wt 169.0 lb

## 2023-10-22 DIAGNOSIS — G4733 Obstructive sleep apnea (adult) (pediatric): Secondary | ICD-10-CM | POA: Diagnosis not present

## 2023-10-22 DIAGNOSIS — Z01419 Encounter for gynecological examination (general) (routine) without abnormal findings: Secondary | ICD-10-CM

## 2023-10-22 DIAGNOSIS — N958 Other specified menopausal and perimenopausal disorders: Secondary | ICD-10-CM

## 2023-10-22 DIAGNOSIS — Z1331 Encounter for screening for depression: Secondary | ICD-10-CM | POA: Diagnosis not present

## 2023-10-22 DIAGNOSIS — N951 Menopausal and female climacteric states: Secondary | ICD-10-CM | POA: Diagnosis not present

## 2023-10-22 DIAGNOSIS — R6882 Decreased libido: Secondary | ICD-10-CM

## 2023-10-22 MED ORDER — ZEPBOUND 2.5 MG/0.5ML ~~LOC~~ SOAJ: 2.5000 mg | SUBCUTANEOUS | 0 refills | Status: AC

## 2023-10-22 MED ORDER — ESTRADIOL 0.025 MG/24HR TD PTTW: 1.0000 | MEDICATED_PATCH | TRANSDERMAL | 12 refills | Status: DC

## 2023-10-22 MED ORDER — IMVEXXY MAINTENANCE PACK 10 MCG VA INST: 1.0000 | VAGINAL_INSERT | VAGINAL | 11 refills | Status: AC

## 2023-10-22 NOTE — Progress Notes (Signed)
 Martha Salazar Sep 20, 1965 [id number]   History:  58 y.o. G1P1 presents for annual exam as a new patient. Struggling with weight loss. Previously on ozempic and wegovy, insurance stopped covering. She has sleep apnea with AHI of 17. Has not tried zepbound  but would like to try. She exercises daily and eats a high protein high fiber diet. Works from home for the [employer]. Complains of menopause symptoms, hot flashes, decreased libido, vaginal dryness, low energy and joint pain. Has a hx of back problems and Afib, SVT. Followed by cardiology, next appt next week. Interested in HRT to manage her symptoms.  Gynecologic History Hysterectomy 2010  Sexually active: yes  Health Maintenance Last Pap: 2010. Results were: abnormal Last mammogram: 3/25. Results were: normal Last colonoscopy: 2022      10/22/2023    9:25 AM 08/18/2023    9:50 AM 08/20/2022    2:44 PM  Depression screen PHQ 2/9  Decreased Interest 3 0 0  Down, Depressed, Hopeless 0 0 0  PHQ - 2 Score 3 0 0  Altered sleeping 2    Tired, decreased energy 3    Change in appetite 1    Feeling bad or failure about yourself  0    Trouble concentrating 2    Moving slowly or fidgety/restless 0    Suicidal thoughts 0    PHQ-9 Score 11    Difficult doing work/chores Somewhat difficult       Past medical history, past surgical history, family history and social history were all reviewed and documented in the EPIC chart.  ROS:  A ROS was performed and pertinent positives and negatives are included.  Exam:  Vitals:   10/22/23 0922 10/22/23 0924  BP: (!) 142/84 (!) 144/86  Pulse: 83   SpO2: 99%   Weight: 169 lb (76.7 kg)   Height: 5' 2.75 (1.594 m)    Body mass index is 30.18 kg/m.  General appearance:  Normal Thyroid:  Symmetrical, normal in size, without palpable masses or nodularity. Respiratory  Auscultation:  Clear without wheezing or rhonchi Cardiovascular  Auscultation:  Regular rate, without rubs, murmurs or  gallops  Edema/varicosities:  Not grossly evident Abdominal  Soft,nontender, without masses, guarding or rebound.  Liver/spleen:  No organomegaly noted  Hernia:  None appreciated  Skin  Inspection:  Grossly normal Breasts: Examined lying and sitting.   Right: Without masses, retractions, nipple discharge or axillary adenopathy.   Left: Without masses, retractions, nipple discharge or axillary adenopathy. Genitourinary   Inguinal/mons:  Normal without inguinal adenopathy  External genitalia:  Normal appearing vulva with no masses, tenderness, or lesions  BUS/Urethra/Skene's glands:  Normal  Vagina:  Normal appearing with normal color and discharge, no lesions. Atrophy moderate  Cervix:  absent  Uterus:  absent  Adnexa/parametria:     Rt: Normal in size, without masses or tenderness.   Lt: Normal in size, without masses or tenderness.  Anus and perineum: Normal   Darice Hoit, CMA present for exam  Assessment/Plan:   1. Well woman exam with routine gynecological exam (Primary) - Cytology - PAP( [place])  2. Vasomotor symptoms due to menopause Will clear with cardiology before starting, should not be any contraindication since bioidentical transdermal. Risks and benefits reviewed with patient as well as safety profile of bioidentical vs synthetic estrogens. - Estradiol  - estradiol  (VIVELLE -DOT) 0.025 MG/24HR; Place 1 patch onto the skin 2 (two) times a week.  Dispense: 8 patch; Refill: 12  3. Low libido - Testos,Total,Free and SHBG (Female)  4. Genitourinary syndrome of menopause - Estradiol  (IMVEXXY  MAINTENANCE PACK) 10 MCG INST; Place 1 tablet vaginally 2 (two) times a week.  Dispense: 8 each; Refill: 11  5. Obstructive sleep apnea syndrome - tirzepatide  (ZEPBOUND ) 2.5 MG/0.5ML Pen; Inject 2.5 mg into the skin once a week.  Dispense: 2 mL; Refill: 0  -ozempic in the past, only covered with type 2 diabetes  -Wegovy: $$ increase, so no longer taking. No option for tier  exception with her current plan, she is going to look into other plans.  -Contrave: side effects (nausea and vomiting) -Phentermine: contraindicated because of cardiac history -Qsymia: contraindicated because of cardiac history -Orlistat: contraindicated because of risk of electrolyte imbalance   Highest weight: 180 Current weight:169 Goal weight: 140  6. Depression screen Managed by PCP  Follow up 4 weeks after starting Zepbound  for weight check  Darnel Mchan B WHNP-BC 10:09 AM 10/22/2023

## 2023-10-23 ENCOUNTER — Ambulatory Visit: Payer: Self-pay | Admitting: Radiology

## 2023-10-26 ENCOUNTER — Telehealth: Payer: Self-pay

## 2023-10-26 LAB — CYTOLOGY - PAP
Comment: NEGATIVE
Diagnosis: NEGATIVE
High risk HPV: NEGATIVE

## 2023-10-26 NOTE — Telephone Encounter (Signed)
 Received a request from Covermymeds for a PA on the Zepbound .   Key: [id number]  DX: [id number]  I have submitted the requests   Pt has been made aware.

## 2023-10-27 NOTE — Telephone Encounter (Signed)
 Per FEP clinical call center:  Zepbound  is not covered on the patient's benefit plan.  To discuss formulary alternatives to products not covered on the benefit plan, please call [phone] and request to speak with a pharmacist to discuss formulary alternatives.  Sent to provider for review.

## 2023-10-27 NOTE — Telephone Encounter (Signed)
 To initiate an authorization request for this medication, please contact [employer] Program (FEP) at [phone]. Insurance contacted:  I spoke to [employer] (representative @ FEP). Zepbound  is not covered on patient's insurance formulary.  Representative will fax information with covered alternatives.

## 2023-10-28 LAB — ESTRADIOL: Estradiol: <15 pg/mL

## 2023-10-28 LAB — TESTOS,TOTAL,FREE AND SHBG (FEMALE)
Free Testosterone: 1 pg/mL (ref 0.1–6.4)
Sex Hormone Binding: 50 nmol/L (ref 14–73)
Testosterone, Total, LC-MS-MS: 9 ng/dL (ref 2–45)

## 2023-11-03 ENCOUNTER — Other Ambulatory Visit: Payer: Self-pay | Admitting: Radiology

## 2023-11-03 DIAGNOSIS — G4733 Obstructive sleep apnea (adult) (pediatric): Secondary | ICD-10-CM

## 2023-11-03 NOTE — Telephone Encounter (Signed)
 Correct. Only needs if she starts the medication.

## 2023-11-04 ENCOUNTER — Other Ambulatory Visit: Payer: Self-pay | Admitting: Radiology

## 2023-11-04 ENCOUNTER — Telehealth: Payer: Self-pay

## 2023-11-04 DIAGNOSIS — E66811 Obesity, class 1: Secondary | ICD-10-CM

## 2023-11-04 MED ORDER — WEGOVY 0.25 MG/0.5ML ~~LOC~~ SOAJ: 0.2500 mg | SUBCUTANEOUS | 0 refills | Status: DC

## 2023-11-04 NOTE — Telephone Encounter (Signed)
 Per review of EPIC, patient has an open refill encounter dated 11/03/23 with med change request to semaglutide .   Routing to Jami

## 2023-11-04 NOTE — Telephone Encounter (Signed)
 Semaglutide  RX request received from patients pharmacy.   Reviewed with Jami. Semaglutide  Rx sent, will wait to see if covered by plan. Shasta is recommending patient contact her plan to determine why zepbound  not covered for sleep apnea specifically. Zepbound  is typically covered for sleep apnea.   Call placed to patient to further discuss. Patient unable to talk, request information via MyChart.

## 2023-11-04 NOTE — Telephone Encounter (Signed)
 That refill encounter was never routed to provider. Will send rx for semaglutide .

## 2023-11-04 NOTE — Telephone Encounter (Signed)
 Prior authorization request received from covermymeds for Wegovy  0.25 mg. KEY: [id number]. PA completed, received message back:  Information regarding your request Your PA Request has been closed. The receiver is not the PA processor for the drug requested. For further inquiries please contact the number on the back of the member's prescription card. Spoke to pharmacist at [facility], with phone PA [phone]) if PA approved $400 copay (with discount $400) or can change to Trulicity.  Per Shasta, she will not change to Trulicity, and above cost is too expensive.  Pharmacy notified.

## 2023-11-04 NOTE — Telephone Encounter (Addendum)
 Changes requested:   From:   Zepbound  2.5 mg - TO - Wegovy  0.25 mg  Last AEX:  10/22/23 Next AEX: 10/25/24  Refill authorized?  Please approve or deny, as appropriate.

## 2023-11-05 NOTE — Telephone Encounter (Signed)
 That is not a medication we would prescribe in our office. Can follow up with PCP if they would like to give her that alternative.

## 2023-11-17 ENCOUNTER — Ambulatory Visit: Admitting: Medical

## 2023-11-17 VITALS — BP 110/72 | HR 70 | Temp 97.1°F | Wt 172.6 lb

## 2023-11-17 DIAGNOSIS — M5432 Sciatica, left side: Secondary | ICD-10-CM | POA: Diagnosis not present

## 2023-11-17 DIAGNOSIS — R52 Pain, unspecified: Secondary | ICD-10-CM

## 2023-11-17 DIAGNOSIS — E785 Hyperlipidemia, unspecified: Secondary | ICD-10-CM | POA: Diagnosis not present

## 2023-11-17 DIAGNOSIS — I4891 Unspecified atrial fibrillation: Secondary | ICD-10-CM | POA: Diagnosis not present

## 2023-11-17 DIAGNOSIS — M5134 Other intervertebral disc degeneration, thoracic region: Secondary | ICD-10-CM | POA: Diagnosis not present

## 2023-11-17 NOTE — Progress Notes (Signed)
 Name: Martha Salazar   Date of Visit: 11/17/23   CHIEF COMPLAINT:  Chief Complaint  Patient presents with   Acute Visit    Side pain x 1 month, when laying down feels a burning sensation and feels a knot.        HPI:  Discussed the use of AI scribe software for clinical note transcription with the patient, who gave verbal consent to proceed.  History of Present Illness   Martha Salazar is a 58 year old female who presents with burning side pain.  She experiences burning pain on her side, particularly when lying down. The pain is described as burning with a sensation of a knot in the area, which is sore and tender. She reports that the pain radiates down the front of her abdomen and that the burning is worse at night. Pressing on the area feels like a 'knot' or 'egg'.  She has a history of low blood pressure and has experienced multiple falls in the past, although not recently. She mentions having passed out and fallen on her back previously, which was evaluated by cardiology. She has not had any recent injuries or falls.  She has a known history of sciatica, which feels like something is 'ripping' when she lifts her leg. This has been flaring up over the last couple of weeks, causing significant discomfort when walking.  She is currently taking Crestor 10 mg for cholesterol management, which was started after her last physical in July. She also takes vitamin D  once a week on Sundays. For her nerve pain, she uses tizanidine , typically taking 2 mg at night, and occasionally increases the dose to 4 mg when the pain is severe. She also takes duloxetine , which helps manage her symptoms at night.  She does not engage in regular exercise due to her symptoms. She has previously seen a chiropractor and found some relief, but has not been consistent with visits recently.     Past Medical History:  Diagnosis Date   Acid reflux    Chronic constipation    Hyperlipidemia    Hypertension     OSA (obstructive sleep apnea)    oral airway device   Sciatica    SVT (supraventricular tachycardia)    Current Outpatient Medications on File Prior to Visit  Medication Sig Dispense Refill   ACIDOPHILUS LACTOBACILLUS PO Take by mouth.     carvedilol (COREG) 25 MG tablet Take 25 mg by mouth 2 (two) times daily with a meal.     diltiazem  2 % GEL Apply 1 Application topically 4 (four) times daily. 30 g 1   DULoxetine  (CYMBALTA ) 30 MG capsule Take 1 capsule (30 mg total) by mouth daily. 90 capsule 0   Estradiol  (IMVEXXY  MAINTENANCE PACK) 10 MCG INST Place 1 tablet vaginally 2 (two) times a week. 8 each 11   flecainide (TAMBOCOR) 50 MG tablet Take 50 mg by mouth 2 (two) times daily.     fluocinonide  (LIDEX ) 0.05 % external solution Apply 1 Application topically 2 (two) times daily. 60 mL 1   hydrocortisone  2.5 % cream Apply topically 2 (two) times daily. 30 g 1   LINZESS  72 MCG capsule TAKE 1 CAPSULE BY MOUTH DAILY BEFORE BREAKFAST. 90 capsule 0   losartan (COZAAR) 100 MG tablet Take 100 mg by mouth daily.     metFORMIN (GLUCOPHAGE-XR) 500 MG 24 hr tablet Take 1,000 mg by mouth.     omeprazole (PRILOSEC) 20 MG capsule Take 20 mg by mouth daily.  rosuvastatin (CRESTOR) 10 MG tablet Take 10 mg by mouth daily.     spironolactone (ALDACTONE) 25 MG tablet Take 25 mg by mouth daily.     tirzepatide  (ZEPBOUND ) 2.5 MG/0.5ML Pen Inject 2.5 mg into the skin once a week. 2 mL 0   tiZANidine  (ZANAFLEX ) 2 MG tablet Take 1 tablet (2 mg total) by mouth at bedtime as needed for muscle spasms. 30 tablet 1   Vitamin D , Ergocalciferol , (DRISDOL ) 1.25 MG (50000 UNIT) CAPS capsule Take 1 capsule (50,000 Units total) by mouth every 7 (seven) days. 12 capsule 3   estradiol  (VIVELLE -DOT) 0.025 MG/24HR Place 1 patch onto the skin 2 (two) times a week. (Patient not taking: Reported on 11/17/2023) 8 patch 12   No current facility-administered medications on file prior to visit.   Past Surgical History:  Procedure  Laterality Date   BREAST BIOPSY Left    CESAREAN SECTION     COLONOSCOPY  02/2020   repeat 2027   KNEE SURGERY Right    meniscal repair   LAPAROSCOPIC VAGINAL HYSTERECTOMY  2010   still has ovaries      OBJECTIVE:    BP 110/72   Pulse 70   Temp (!) 97.1 F (36.2 C)   Wt 172 lb 9.6 oz (78.3 kg)   SpO2 100%   BMI 30.82 kg/m    General appearence: alert, no distress, WD/WN,  Chest wall: Mild tenderness over the left mid posterior lateral chest wall in the thoracic 5-6 region, but no obvious swelling or deformity or nodule or not.  No pain with specific inspiration expiration.  No pain with percussion of the ribs or the spine Back otherwise nontender, chest otherwise nontender The area that she perceives as a Air traffic controller knot is actually one of her floating ribs.  Reassured.  Nontender over this area on the left Lungs: CTA bilaterally, no wheezes, rhonchi, or rales Abdomen: +bs, soft, non tender, non distended, no masses, no hepatomegaly, no splenomegaly Extremities: no edema, no cyanosis, no clubbing Pulses: 2+ symmetric, upper and lower extremities, normal cap refill Legs without obvious tenderness, normal range of motion of hips and legs Legs neurovascularly intact    ASSESSMENT/PLAN:   Encounter Diagnoses  Name Primary?   Discogenic thoracic pain Yes   Burning pain    Sciatic nerve pain, left    Hyperlipidemia, unspecified hyperlipidemia type    Atrial fibrillation, unspecified type [facility])       Thoracic radiculopathy and spondylosis Burning thoracic pain likely from nerve compression or discogenic origin. Imaging from 2022 chest xray shows spondylosis and curvature. Floating ribs identified as non-pathological source of perceived knot, reassured. - advised she do a follow up with chiropractor for manipulation and therapy. - Encourage daily stretching exercises. - Consider back x-ray if symptoms persist. - Use tizanidine  as needed for muscle relaxation.  She has this  at home already - Consider Aleve  for pain management if approved by cardiologist.  Left-sided sciatica Intermittent left sciatic pain likely from lumbar nerve compression. -follow up with chiropractor for manipulation and therapy. - Encourage daily stretching exercises. - Use tizanidine  as needed for muscle relaxation.  Paroxysmal atrial fibrillation Managed with flecainide. Considering ablation due to side effects, including blurred vision. Coordination with cardiologist for potential ablation. - Coordinate with cardiologist as scheduled regarding potential ablation procedure.  Hyperlipidemia On Crestor 10 mg since July. Lipid panel recheck needed to assess treatment efficacy. - Order lipid panel for cholesterol recheck.  Return at your convenience fasting for labs  Cathalina was seen today for acute visit.  Diagnoses and all orders for this visit:  Discogenic thoracic pain -     DG Thoracic Spine 4V; Future  Burning pain -     DG Thoracic Spine 4V; Future  Sciatic nerve pain, left -     DG Lumbar Spine Complete; Future  Hyperlipidemia, unspecified hyperlipidemia type -     Lipid panel; Future -     ALT; Future  Atrial fibrillation, unspecified type [facility])    [facility]

## 2023-11-18 ENCOUNTER — Ambulatory Visit: Admitting: Radiology

## 2023-11-18 NOTE — Telephone Encounter (Signed)
 AEX 10/22/23

## 2023-11-24 ENCOUNTER — Other Ambulatory Visit: Payer: Self-pay | Admitting: Radiology

## 2023-11-24 DIAGNOSIS — N951 Menopausal and female climacteric states: Secondary | ICD-10-CM

## 2023-11-24 MED ORDER — ESTRADIOL 0.25 MG/0.25GM TD GEL: 1.0000 | Freq: Every day | TRANSDERMAL | 3 refills | Status: DC

## 2023-12-04 ENCOUNTER — Ambulatory Visit: Admitting: Radiology

## 2024-01-26 ENCOUNTER — Telehealth: Payer: Self-pay

## 2024-01-26 ENCOUNTER — Other Ambulatory Visit (HOSPITAL_COMMUNITY): Payer: Self-pay

## 2024-01-26 MED ORDER — LINACLOTIDE 72 MCG PO CAPS: 72.0000 ug | ORAL_CAPSULE | Freq: Every day | ORAL | 1 refills | Status: AC

## 2024-01-26 NOTE — Telephone Encounter (Signed)
 Pharmacy Patient Advocate Encounter   Received notification from Onbase that prior authorization for linaclotide  (LINZESS ) 72 MCG capsule  is required/requested.   Insurance verification completed.   The patient is insured through [employer] [facility] [employer] [facility].   Per test claim: PA required; PA submitted to above mentioned insurance via Latent Key/confirmation #/EOC [id number] Status is pending

## 2024-01-27 NOTE — Telephone Encounter (Signed)
 Pharmacy Patient Advocate Encounter  Received notification from [employer] that Prior Authorization for Linzess  capsules has been APPROVED  to 12.16.26. Ran test claim. This test claim was processed through [facility]- copay amounts may vary at other pharmacies due to pharmacy/plan contracts, or as the patient moves through the different stages of their insurance plan.   PA #/Case ID/Reference #: [id number]

## 2024-03-11 ENCOUNTER — Ambulatory Visit: Payer: Self-pay

## 2024-03-11 NOTE — Telephone Encounter (Signed)
 FYI Only or Action Required?: FYI only for provider: ED advised.  Patient was last seen in primary care on 11/17/2023 by Bulah Alm RAMAN, PA-C.  Called Nurse Triage reporting nose bleeds and Chest Pain.  Symptoms began 2 weeks ago.  Interventions attempted: OTC medications: afrin.  Symptoms are: stable.  Triage Disposition: Go to ED Now (Notify PCP)  Patient/caregiver understands and will follow disposition?: Yes  Reason for Triage: The patient is experiencing heavy nosebleeds this morning, with blood pouring out in large clots. Each episode lasts about five minutes  Reason for Disposition  [1] Large amount of blood has been lost (e.g., 1 cup or 240 ml) AND [2] bleeding now controlled (stopped)  Pain also in shoulder(s) or arm(s) or jaw  (Exception: Pain is clearly made worse by movement.)  Answer Assessment - Initial Assessment Questions 1. LOCATION: Where does it hurt?       RT collar bone area 2. RADIATION: Does the pain go anywhere else? (e.g., into neck, jaw, arms, back)     Down to rt breast 3. ONSET: When did the chest pain begin? (Minutes, hours or days)      3 days ago 4. PATTERN: Does the pain come and go, or has it been constant since it started?  Does it get worse with exertion?      constant  6. SEVERITY: How bad is the pain?  (e.g., Scale 1-10; mild, moderate, or severe)     2/10, better than the last few days 7. CARDIAC RISK FACTORS: Do you have any history of heart problems or risk factors for heart disease? (e.g., angina, prior heart attack; diabetes, high blood pressure, high cholesterol, smoker, or strong family history of heart disease)     yes   10. OTHER SYMPTOMS: Do you have any other symptoms? (e.g., dizziness, nausea, vomiting, sweating, fever, difficulty breathing, cough)       Denies lightheadedness, sob  Protocols used: Nosebleed-A-AH, Chest Pain-A-AH

## 2024-03-18 ENCOUNTER — Other Ambulatory Visit: Payer: Self-pay | Admitting: Radiology

## 2024-03-18 DIAGNOSIS — N951 Menopausal and female climacteric states: Secondary | ICD-10-CM

## 2024-03-18 NOTE — Telephone Encounter (Signed)
 Med refill request: estradiol  0.25 mg gel Last AEX: 10/22/23 Next AEX: 10/25/24 Last MMG (if hormonal med) 04/24/23 BI-RADS 1 negative Last refill: 02/16/2024  Refill sent to provider for approval.  See MyChart message from 03/01/24.

## 2024-08-25 ENCOUNTER — Encounter: Payer: Self-pay | Admitting: Medical

## 2024-10-25 ENCOUNTER — Ambulatory Visit: Admitting: Radiology
